# Patient Record
Sex: Male | Born: 1955
Health system: Southern US, Community
[De-identification: ages and names within clinical notes are randomized; demographics above are authoritative.]

---

## 2001-01-04 ENCOUNTER — Emergency Department (HOSPITAL_COMMUNITY): Admission: EM | Admit: 2001-01-04 | Discharge: 2001-01-04 | Payer: Self-pay | Admitting: Emergency Medicine

## 2001-01-04 ENCOUNTER — Encounter: Payer: Self-pay | Admitting: Emergency Medicine

## 2001-01-22 ENCOUNTER — Encounter: Admission: RE | Admit: 2001-01-22 | Discharge: 2001-01-22 | Payer: Self-pay | Admitting: Urology

## 2001-01-22 ENCOUNTER — Encounter: Payer: Self-pay | Admitting: Urology

## 2001-02-01 ENCOUNTER — Encounter: Payer: Self-pay | Admitting: Emergency Medicine

## 2001-02-01 ENCOUNTER — Emergency Department (HOSPITAL_COMMUNITY): Admission: EM | Admit: 2001-02-01 | Discharge: 2001-02-01 | Payer: Self-pay | Admitting: Emergency Medicine

## 2001-02-10 ENCOUNTER — Ambulatory Visit (HOSPITAL_COMMUNITY): Admission: RE | Admit: 2001-02-10 | Discharge: 2001-02-10 | Payer: Self-pay | Admitting: Urology

## 2004-12-12 ENCOUNTER — Encounter: Admission: RE | Admit: 2004-12-12 | Discharge: 2004-12-12 | Payer: Self-pay | Admitting: Specialist

## 2015-12-19 DIAGNOSIS — L821 Other seborrheic keratosis: Secondary | ICD-10-CM | POA: Diagnosis not present

## 2015-12-19 DIAGNOSIS — D225 Melanocytic nevi of trunk: Secondary | ICD-10-CM | POA: Diagnosis not present

## 2015-12-19 DIAGNOSIS — Z85828 Personal history of other malignant neoplasm of skin: Secondary | ICD-10-CM | POA: Diagnosis not present

## 2015-12-19 DIAGNOSIS — L570 Actinic keratosis: Secondary | ICD-10-CM | POA: Diagnosis not present

## 2016-04-02 DIAGNOSIS — Z125 Encounter for screening for malignant neoplasm of prostate: Secondary | ICD-10-CM | POA: Diagnosis not present

## 2016-04-02 DIAGNOSIS — Z0000 Encounter for general adult medical examination without abnormal findings: Secondary | ICD-10-CM | POA: Diagnosis not present

## 2016-04-08 DIAGNOSIS — N200 Calculus of kidney: Secondary | ICD-10-CM | POA: Diagnosis not present

## 2016-04-08 DIAGNOSIS — Z1212 Encounter for screening for malignant neoplasm of rectum: Secondary | ICD-10-CM | POA: Diagnosis not present

## 2016-04-08 DIAGNOSIS — Z8249 Family history of ischemic heart disease and other diseases of the circulatory system: Secondary | ICD-10-CM | POA: Diagnosis not present

## 2016-04-08 DIAGNOSIS — Z0001 Encounter for general adult medical examination with abnormal findings: Secondary | ICD-10-CM | POA: Diagnosis not present

## 2016-04-08 DIAGNOSIS — Z23 Encounter for immunization: Secondary | ICD-10-CM | POA: Diagnosis not present

## 2016-04-08 DIAGNOSIS — E663 Overweight: Secondary | ICD-10-CM | POA: Diagnosis not present

## 2016-12-18 DIAGNOSIS — Z85828 Personal history of other malignant neoplasm of skin: Secondary | ICD-10-CM | POA: Diagnosis not present

## 2016-12-18 DIAGNOSIS — D1801 Hemangioma of skin and subcutaneous tissue: Secondary | ICD-10-CM | POA: Diagnosis not present

## 2016-12-18 DIAGNOSIS — L821 Other seborrheic keratosis: Secondary | ICD-10-CM | POA: Diagnosis not present

## 2016-12-18 DIAGNOSIS — L570 Actinic keratosis: Secondary | ICD-10-CM | POA: Diagnosis not present

## 2016-12-18 DIAGNOSIS — L814 Other melanin hyperpigmentation: Secondary | ICD-10-CM | POA: Diagnosis not present

## 2017-05-04 DIAGNOSIS — Z0000 Encounter for general adult medical examination without abnormal findings: Secondary | ICD-10-CM | POA: Diagnosis not present

## 2017-05-07 DIAGNOSIS — Z0000 Encounter for general adult medical examination without abnormal findings: Secondary | ICD-10-CM | POA: Diagnosis not present

## 2017-05-07 DIAGNOSIS — Z1212 Encounter for screening for malignant neoplasm of rectum: Secondary | ICD-10-CM | POA: Diagnosis not present

## 2017-12-28 DIAGNOSIS — D1801 Hemangioma of skin and subcutaneous tissue: Secondary | ICD-10-CM | POA: Diagnosis not present

## 2017-12-28 DIAGNOSIS — D225 Melanocytic nevi of trunk: Secondary | ICD-10-CM | POA: Diagnosis not present

## 2017-12-28 DIAGNOSIS — L988 Other specified disorders of the skin and subcutaneous tissue: Secondary | ICD-10-CM | POA: Diagnosis not present

## 2017-12-28 DIAGNOSIS — L821 Other seborrheic keratosis: Secondary | ICD-10-CM | POA: Diagnosis not present

## 2017-12-28 DIAGNOSIS — L570 Actinic keratosis: Secondary | ICD-10-CM | POA: Diagnosis not present

## 2017-12-28 DIAGNOSIS — L820 Inflamed seborrheic keratosis: Secondary | ICD-10-CM | POA: Diagnosis not present

## 2018-05-12 DIAGNOSIS — E7800 Pure hypercholesterolemia, unspecified: Secondary | ICD-10-CM | POA: Diagnosis not present

## 2018-05-12 DIAGNOSIS — Z0000 Encounter for general adult medical examination without abnormal findings: Secondary | ICD-10-CM | POA: Diagnosis not present

## 2018-05-12 DIAGNOSIS — Z125 Encounter for screening for malignant neoplasm of prostate: Secondary | ICD-10-CM | POA: Diagnosis not present

## 2018-05-17 DIAGNOSIS — Z0000 Encounter for general adult medical examination without abnormal findings: Secondary | ICD-10-CM | POA: Diagnosis not present

## 2018-05-17 DIAGNOSIS — Z23 Encounter for immunization: Secondary | ICD-10-CM | POA: Diagnosis not present

## 2018-12-31 DIAGNOSIS — Z23 Encounter for immunization: Secondary | ICD-10-CM | POA: Diagnosis not present

## 2019-03-02 DIAGNOSIS — Z23 Encounter for immunization: Secondary | ICD-10-CM | POA: Diagnosis not present

## 2019-04-25 DIAGNOSIS — Z23 Encounter for immunization: Secondary | ICD-10-CM | POA: Diagnosis not present

## 2019-05-24 DIAGNOSIS — Z125 Encounter for screening for malignant neoplasm of prostate: Secondary | ICD-10-CM | POA: Diagnosis not present

## 2019-05-24 DIAGNOSIS — Z1159 Encounter for screening for other viral diseases: Secondary | ICD-10-CM | POA: Diagnosis not present

## 2019-05-24 DIAGNOSIS — Z0000 Encounter for general adult medical examination without abnormal findings: Secondary | ICD-10-CM | POA: Diagnosis not present

## 2019-05-27 DIAGNOSIS — Z1212 Encounter for screening for malignant neoplasm of rectum: Secondary | ICD-10-CM | POA: Diagnosis not present

## 2019-05-27 DIAGNOSIS — E7800 Pure hypercholesterolemia, unspecified: Secondary | ICD-10-CM | POA: Diagnosis not present

## 2019-05-27 DIAGNOSIS — Z0000 Encounter for general adult medical examination without abnormal findings: Secondary | ICD-10-CM | POA: Diagnosis not present

## 2020-06-21 NOTE — Progress Notes (Signed)
Subjective:    I'm seeing this patient as a consultation for:  Dr. Shelia Media. Note will be routed back to referring provider/PCP.  CC: R shoulder pain  I, Molly Weber, LAT, ATC, am serving as scribe for Dr. Lynne Leader.  HPI: Pt is a 64 y/o male presenting w/ c/o R shoulder pain x 1-2 months. No known MOI. Pt locates his pain to lateral deltoid to anterior aspect. Pain with shoulder flexion and IR. No UE numbness/tingling noted.  History of prior right shoulder dislocation in the 80s.  None since the late 80s early 90s.  Physically fit does weightlifting exercises regularly.  History left shoulder MRI arthrogram showing rotator cuff tendinopathy treated with PT in 2006.  Radiating pain: no Aggravating factors: attempts at R shoulder overhead AROM;  Treatments tried: Alieve, asprain, stretching  Past medical history, Surgical history, Family history, Social history, Allergies, and medications have been entered into the medical record, reviewed. Manages a college bookstore.  Review of Systems: No new headache, visual changes, nausea, vomiting, diarrhea, constipation, dizziness, abdominal pain, skin rash, fevers, chills, night sweats, weight loss, swollen lymph nodes, body aches, joint swelling, muscle aches, chest pain, shortness of breath, mood changes, visual or auditory hallucinations.   Objective:    Vitals:   06/22/20 0922  BP: 110/72  Pulse: 62  SpO2: 97%   General: Well Developed, well nourished, and in no acute distress.  Neuro/Psych: Alert and oriented x3, extra-ocular muscles intact, able to move all 4 extremities, sensation grossly intact. Skin: Warm and dry, no rashes noted.  Respiratory: Not using accessory muscles, speaking in full sentences, trachea midline.  Cardiovascular: Pulses palpable, no extremity edema. Abdomen: Does not appear distended. MSK: Right shoulder normal-appearing nontender. Normal motion. Intact strength abduction external/internal  rotation. Positive Hawkins and Neer's test.  Positive empty can test. Negative Yergason's and speeds test. Mildly positive O'Brien test. Negative clunk and relocation test. Pulses cap refill and sensation are intact distally.  Lab and Radiology Results  X-ray images right shoulder obtained today personally and independently interpreted No fractures visible.  No severe DJD glenohumeral joint.  Mild AC DJD.  Possible Hill-Sachs deformity present. Await formal radiology review  Diagnostic Limited MSK Ultrasound of: Right shoulder Biceps tendon intact normal-appearing Subscapularis tendon intact normal-appearing Supraspinatus tendon is intact without tear.  Moderate subacromial bursa thickness is present indicating bursitis. Infraspinatus tendon is intact. Sequelae of Hill-Sachs lesion present posterior and superior humeral head with V-shaped cortical depression. AC joint narrowed degenerative with effusion Impression: Subacromial bursitis, AC DJD, evidence Hill-Sachs lesion   Impression and Recommendations:    Assessment and Plan: 64 y.o. male with right shoulder pain due to rotator cuff tendinopathy, impingement/instability, and subacromial bursitis.  AC DJD may play a role here as well.  Discussed options.  Plan for physical therapy to focus on rotator cuff stabilization and strengthening.  Additionally home exercises taught in clinic today by ATC.  Also recommend Voltaren gel.  If not improving consider either steroid injection versus an MRI arthrogram to further characterize cause of pain and for potential surgical or injection planning.  Recommend recheck in 6 to 8 weeks.Marland Kitchen  PDMP not reviewed this encounter. Orders Placed This Encounter  Procedures  . [place] LIMITED JOINT SPACE STRUCTURES UP RIGHT(NO LINKED CHARGES)    Standing Status:   Future    Number of Occurrences:   1    Standing Expiration Date:   12/21/2020    Order Specific Question:   Reason for Exam (SYMPTOM  OR DIAGNOSIS  REQUIRED)    Answer:   chronic right shoulder pain    Order Specific Question:   Preferred imaging location?    Answer:   [facility]  . DG Shoulder Right    Standing Status:   Future    Number of Occurrences:   1    Standing Expiration Date:   06/22/2021    Order Specific Question:   Reason for Exam (SYMPTOM  OR DIAGNOSIS REQUIRED)    Answer:   chronic right sholuder pain    Order Specific Question:   Preferred imaging location?    Answer:   [facility]  . Ambulatory referral to Physical Therapy    Referral Priority:   Routine    Referral Type:   Physical Medicine    Referral Reason:   Specialty Services Required    Requested Specialty:   Physical Therapy   No orders of the defined types were placed in this encounter.   Discussed warning signs or symptoms. Please see discharge instructions. Patient expresses understanding.   The above documentation has been reviewed and is accurate and complete Lynne Leader, M.D.

## 2020-06-22 ENCOUNTER — Ambulatory Visit: Payer: Self-pay

## 2020-06-22 ENCOUNTER — Ambulatory Visit (INDEPENDENT_AMBULATORY_CARE_PROVIDER_SITE_OTHER): Payer: 59

## 2020-06-22 ENCOUNTER — Encounter: Payer: Self-pay | Admitting: Family Medicine

## 2020-06-22 ENCOUNTER — Ambulatory Visit (INDEPENDENT_AMBULATORY_CARE_PROVIDER_SITE_OTHER): Payer: 59 | Admitting: Family Medicine

## 2020-06-22 ENCOUNTER — Other Ambulatory Visit: Payer: Self-pay

## 2020-06-22 VITALS — BP 110/72 | HR 62 | Ht 67.0 in | Wt 156.0 lb

## 2020-06-22 DIAGNOSIS — C4491 Basal cell carcinoma of skin, unspecified: Secondary | ICD-10-CM | POA: Insufficient documentation

## 2020-06-22 DIAGNOSIS — M25511 Pain in right shoulder: Secondary | ICD-10-CM | POA: Diagnosis not present

## 2020-06-22 DIAGNOSIS — Z8739 Personal history of other diseases of the musculoskeletal system and connective tissue: Secondary | ICD-10-CM | POA: Diagnosis not present

## 2020-06-22 DIAGNOSIS — G8929 Other chronic pain: Secondary | ICD-10-CM | POA: Diagnosis not present

## 2020-06-22 NOTE — Patient Instructions (Addendum)
Thank you for coming in today.  I've referred you to Physical Therapy.  Let us know if you don't hear from them in one week.  Please get an Xray today before you leave [facility] at [email or web address] using code: [id number]  Please use voltaren gel up to 4x daily for pain as needed.   Recheck in 6-8 weeks especially if not improved.  Next step is either injection vs MRI arthrogram.    Shoulder Impingement Syndrome  Shoulder impingement syndrome is a condition that causes pain when connective tissues (tendons) surrounding the shoulder joint become pinched. These tendons are part of the group of muscles and tissues that help to stabilize the shoulder (rotator cuff). Beneath the rotator cuff is a fluid-filled sac (bursa) that allows the muscles and tendons to glide smoothly. The bursa may become swollen or irritated (bursitis). Bursitis, swelling in the rotator cuff tendons, or both conditions can decrease how much space is under a bone in the shoulder joint (acromion), resulting in impingement. What are the causes? Shoulder impingement syndrome may be caused by bursitis or swelling of the rotator cuff tendons, which may result from:  Repetitive overhead arm movements.  Falling onto the shoulder.  Weakness in the shoulder muscles. What increases the risk? You may be more likely to develop this condition if you:  Play sports that involve throwing, such as baseball.  Participate in sports such as tennis, volleyball, and swimming.  Work as a Curator, Games developer, or Architect. Some people are also more likely to develop impingement syndrome because of the shape of their acromion bone. What are the signs or symptoms? The main symptom of this condition is pain on the front or side of the shoulder. The pain may:  Get worse when lifting or raising the arm.  Get worse at night.  Wake you up from sleeping.  Feel sharp when the shoulder is moved and then fade to an ache. Other symptoms may  include:  Tenderness.  Stiffness.  Inability to raise the arm above shoulder level or behind the body.  Weakness. How is this diagnosed? This condition may be diagnosed based on:  Your symptoms and medical history.  A physical exam.  Imaging tests, such as: ? X-rays. ? MRI. ? Ultrasound. How is this treated? This condition may be treated by:  Resting your shoulder and avoiding all activities that cause pain or put stress on the shoulder.  Icing your shoulder.  NSAIDs to help reduce pain and swelling.  One or more injections of medicines to numb the area and reduce inflammation.  Physical therapy.  Surgery. This may be needed if nonsurgical treatments have not helped. Surgery may involve repairing the rotator cuff, reshaping the acromion, or removing the bursa. Follow these instructions at home: Managing pain, stiffness, and swelling   If directed, put ice on the injured area. ? Put ice in a plastic bag. ? Place a towel between your skin and the bag. ? Leave the ice on for 20 minutes, 2-3 times a day. Activity  Rest and return to your normal activities as told by your health care provider. Ask your health care provider what activities are safe for you.  Do exercises as told by your health care provider. General instructions  Do not use any products that contain nicotine or tobacco, such as cigarettes, e-cigarettes, and chewing tobacco. These can delay healing. If you need help quitting, ask your health care provider.  Ask your health care provider when it is safe for you  to drive.  Take over-the-counter and prescription medicines only as told by your health care provider.  Keep all follow-up visits as told by your health care provider. This is important. How is this prevented?  Give your body time to rest between periods of activity.  Be safe and responsible while being active. This will help you avoid falls.  Maintain physical fitness, including strength  and flexibility. Contact a health care provider if:  Your symptoms have not improved after 1-2 months of treatment and rest.  You cannot lift your arm away from your body. Summary  Shoulder impingement syndrome is a condition that causes pain when connective tissues (tendons) surrounding the shoulder joint become pinched.  The main symptom of this condition is pain on the front or side of the shoulder.  This condition is usually treated with rest, ice, and pain medicines as needed. This information is not intended to replace advice given to you by your health care provider. Make sure you discuss any questions you have with your health care provider. Document Revised: 10/29/2018 Document Reviewed: 12/30/2017 Elsevier Patient Education  2020 [employer].

## 2020-06-25 NOTE — Progress Notes (Signed)
Right shoulder x-ray is normal to radiology

## 2020-07-04 ENCOUNTER — Encounter: Payer: Self-pay | Admitting: Rehabilitative and Restorative Service Providers"

## 2020-07-04 ENCOUNTER — Other Ambulatory Visit: Payer: Self-pay

## 2020-07-04 ENCOUNTER — Ambulatory Visit (INDEPENDENT_AMBULATORY_CARE_PROVIDER_SITE_OTHER): Payer: 59 | Admitting: Rehabilitative and Restorative Service Providers"

## 2020-07-04 DIAGNOSIS — G8929 Other chronic pain: Secondary | ICD-10-CM | POA: Diagnosis not present

## 2020-07-04 DIAGNOSIS — M25611 Stiffness of right shoulder, not elsewhere classified: Secondary | ICD-10-CM

## 2020-07-04 DIAGNOSIS — M25511 Pain in right shoulder: Secondary | ICD-10-CM

## 2020-07-04 DIAGNOSIS — M6281 Muscle weakness (generalized): Secondary | ICD-10-CM | POA: Diagnosis not present

## 2020-07-04 NOTE — Patient Instructions (Signed)
Access Code: [email or web address] URL: [email or web address] Date: 07/04/2020 Prepared by: Scot Jun  Exercises Sleeper Stretch - 2 x daily - 7 x weekly - 1 sets - 5 reps - 30 hold Single Arm Doorway Pec Stretch at 90 Degrees Abduction - 2 x daily - 7 x weekly - 1 sets - 5 reps - 30 hold Shoulder External Rotation and Scapular Retraction with Resistance - 2 x daily - 7 x weekly - 2 sets - 10 reps Standing shoulder flexion wall slides - 2 x daily - 7 x weekly - 2 sets - 10 reps  Patient Education Pain Monitoring Model

## 2020-07-04 NOTE — Therapy (Signed)
[facility] [address] [place], [place], [address] Phone: [phone]   Fax:  [phone]  Physical Therapy Evaluation  Patient Details  Name: Mike Carter MRN: [id number] Date of Birth: 1956-03-01 Referring Provider (PT): Dr. Lynne Leader   Encounter Date: 07/04/2020   PT End of Session - 07/04/20 1506    Visit Number 1    Number of Visits 12    Date for PT Re-Evaluation 08/29/20    Progress Note Due on Visit 10    PT Start Time 1512    PT Stop Time 1543    PT Time Calculation (min) 31 min    Activity Tolerance Patient tolerated treatment well    Behavior During Therapy [facility] for tasks assessed/performed           History reviewed. No pertinent past medical history.  History reviewed. No pertinent surgical history.  There were no vitals filed for this visit.    Subjective Assessment - 07/04/20 1515    Subjective Pt. indicated having Rt shoulder pain c some difficulty c mobility, noted overhead, back.  Pt. saw MD who prefermed MSKUS showing bursitis.  Insidious onset about 1-2 months.    Limitations Lifting;Other (comment)   reaching   Diagnostic tests MSKUS    Patient Stated Goals Reduce pain, continue physical training    Currently in Pain? Yes    Pain Score 0-No pain   pain at worst 8/10   Pain Location Shoulder   anterior/posterior shoulder   Pain Orientation Right    Pain Descriptors / Indicators Stabbing    Pain Type Chronic pain    Pain Onset More than a month ago    Pain Frequency Intermittent    Aggravating Factors  reaching overhead, back, waking up due to symptoms but able to get back to sleep    Pain Relieving Factors rest, avoiding movement    Effect of Pain on Daily Activities self care/dressing, lifing/reaching activity at home [name]              [facility] PT Assessment - 07/04/20 0001      Assessment   Medical Diagnosis Rt shoulder pain    Referring Provider (PT) Dr. 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  Lt AROM WFL at this time for elevation, ER/IR.  End range symptoms for Rt arom    AROM Assessment Site Shoulder    Right/Left Shoulder  Left;Right    Right Shoulder Flexion 140 Degrees    Right Shoulder ABduction 135 Degrees    Right Shoulder Internal Rotation 65 Degrees   measured supine in 45 deg abd   Right Shoulder External Rotation 45 Degrees   measured supine in 45 deg abd     PROM   Overall PROM Comments end range discomfort c overpressure for Rt GH PROM.    PROM Assessment Site Shoulder    Right/Left Shoulder Left;Right    Right Shoulder Flexion 145 Degrees    Right Shoulder ABduction 135 Degrees    Right Shoulder Internal Rotation 70 Degrees   measured supine in 45 deg abd   Right Shoulder External Rotation 50 Degrees   measured supine in 45 deg abd     Strength   Strength Assessment Site Shoulder    Right/Left Shoulder Left;Right    Right Shoulder Flexion 4+/5    Right Shoulder ABduction 4+/5    Right Shoulder Internal Rotation 5/5    Right Shoulder External Rotation 5/5    Left Shoulder Flexion 5/5    Left  Shoulder ABduction 5/5    Left Shoulder Internal Rotation 5/5    Left Shoulder External Rotation 5/5      Palpation   Palpation comment TrP noted increased in Rt infraspinatus compared to Lt  Mild restriction in inferior, ap mobs of Rt GH jt in end range      Special Tests   Other special tests (-) Painful arc, lift off, drop arm on Rt UE                      Objective measurements completed on examination: See above findings.       [facility] Adult PT Treatment/Exercise - 07/04/20 0001      Exercises   Exercises Other Exercises;Shoulder    Other Exercises  HEP instruction/performance c trial set of each exercise for comprehension/education.  HEP consisting of sleeper IR stretch 30 sec, doorway ER stretch 30 sec, wall slides flexion, scap retraction c bilateral UE      Manual Therapy   Manual therapy comments g2-g3 inferior jt mobs in elevation mid range, ap mobs Rt GH jt c MWM ER in 45 deg abd                  PT Education - 07/04/20 1506    Education Details HEP, POC    Person(s) Educated Patient    Methods Explanation;Demonstration;Verbal cues;Handout    Comprehension Returned demonstration;Verbalized understanding               PT Long Term Goals - 07/04/20 1507      PT LONG TERM GOAL #1   Title Patient will demonstrate/report pain at worst less than or equal to 2/10 to facilitate minimal limitation in daily activity secondary to pain symptoms.    Time 8    Period Weeks    Status New    Target Date 08/29/20      PT LONG TERM GOAL #2   Title Patient will demonstrate independent use of home exercise program to facilitate ability to maintain/progress functional gains from skilled physical therapy services.    Time 8    Period Weeks    Status New    Target Date 08/29/20      PT LONG TERM GOAL #3   Title Patient will demonstrate return to work/recreational activity at previous level of function without limitations secondary due to condition  Time 8    Period Weeks    Status New    Target Date 08/29/20      PT LONG TERM GOAL #4   Title Patient will demonstrate Rt [place] joint mobility WFL to facilitate usual self care, dressing, reaching overhead at PLOF s limitation due to symptoms.    Time 8    Period Weeks    Status New    Target Date 08/29/20      PT LONG TERM GOAL #5   Title Patient will demonstrate Rt UE MMT 5/5 throughout to facilitate usual lifting, carrying in functional activity to PLOF s limitation.    Time 8    Period Weeks    Status New    Target Date 08/29/20                  Plan - 07/04/20 1508    Clinical Impression Statement Patient is a 64 y.o. male who comes to clinic with complaints of Rt shoulder pain with mobility, strength deficits that impair their ability to perform usual daily and recreational functional activities without increase difficulty/symptoms at this time.  Patient to benefit from skilled PT services to address impairments and limitations to improve to previous level of function without restriction secondary to condition.    Personal Factors and Comorbidities Other   history of shoulder dislocation, previos RTC tendinopathy c therapy   Examination-Activity Limitations Reach Overhead;Lift;Carry;Sleep    Examination-Participation Restrictions Community Activity;Other   workouts   Stability/Clinical Decision Making Stable/Uncomplicated    Clinical Decision Making Low    Rehab Potential Good    PT Frequency --   1-2x/week   PT Duration 8 weeks    PT Treatment/Interventions ADLs/Self Care Home Management;Electrical Stimulation;Iontophoresis 4mg /ml Dexamethasone;Moist Heat;Balance training;Therapeutic exercise;Therapeutic activities;Functional mobility training;Stair training;Gait training;Ultrasound;Neuromuscular re-education;Patient/family education;Manual techniques;Taping;Dry needling;Passive range of motion;Spinal Manipulations;Joint Manipulations    PT Next Visit Plan Reassess  mobility gains, continue to improve end range, possible TrP release for infraspinatus, subscap for motion gains.    PT Home Exercise Plan [email or web address]    Consulted and Agree with Plan of Care Patient           Patient will benefit from skilled therapeutic intervention in order to improve the following deficits and impairments:  Decreased endurance,Pain,Impaired UE functional use,Decreased strength,Decreased activity tolerance,Decreased mobility,Decreased range of motion  Visit Diagnosis: Chronic right shoulder pain  Muscle weakness (generalized)  Stiffness of right shoulder, not elsewhere classified     Problem List Patient Active Problem List   Diagnosis Date Noted   Basal cell carcinoma 06/22/2020   History of closed shoulder dislocation 06/22/2020    Scot Jun, PT, DPT, OCS, ATC 07/04/20  3:50 PM    [facility] [address] [place], [place], [address] Phone: [phone]   Fax:  [phone]  Name: Mike Carter MRN: [id number] Date of Birth: 1956-01-05

## 2020-07-09 ENCOUNTER — Other Ambulatory Visit: Payer: Self-pay

## 2020-07-09 ENCOUNTER — Ambulatory Visit (INDEPENDENT_AMBULATORY_CARE_PROVIDER_SITE_OTHER): Payer: 59 | Admitting: Rehabilitative and Restorative Service Providers"

## 2020-07-09 ENCOUNTER — Encounter: Payer: Self-pay | Admitting: Rehabilitative and Restorative Service Providers"

## 2020-07-09 DIAGNOSIS — M25611 Stiffness of right shoulder, not elsewhere classified: Secondary | ICD-10-CM | POA: Diagnosis not present

## 2020-07-09 DIAGNOSIS — M25511 Pain in right shoulder: Secondary | ICD-10-CM | POA: Diagnosis not present

## 2020-07-09 DIAGNOSIS — G8929 Other chronic pain: Secondary | ICD-10-CM

## 2020-07-09 DIAGNOSIS — M6281 Muscle weakness (generalized): Secondary | ICD-10-CM

## 2020-07-09 NOTE — Therapy (Addendum)
[facility] Physical Therapy [address] [place], [place], [address] Phone: [phone]   Fax:  [phone]  Physical Therapy Treatment/Discharge  Patient Details  Name: Mike Carter MRN: [id number] Date of Birth: 1956-01-10 Referring Provider (PT): Dr. Lynne Leader   Encounter Date: 07/09/2020   PT End of Session - 07/09/20 0844    Visit Number 2    Number of Visits 12    Date for PT Re-Evaluation 08/29/20    Progress Note Due on Visit 10    PT Start Time 0844    PT Stop Time 0923    PT Time Calculation (min) 39 min    Activity Tolerance Patient tolerated treatment well    Behavior During Therapy [facility] for tasks assessed/performed           History reviewed. No pertinent past medical history.  History reviewed. No pertinent surgical history.  There were no vitals filed for this visit.   Subjective Assessment - 07/09/20 0905    Subjective Pt. stated feeling some soreness overall after last visit but nothing major.  Still feeling some tightness in shoulder.    Limitations Lifting;Other (comment)   reaching   Diagnostic tests MSKUS    Patient Stated Goals Reduce pain, continue physical training    Currently in Pain? No/denies    Pain Onset More than a month ago                             [facility] Adult PT Treatment/Exercise - 07/09/20 0001      Shoulder Exercises: Standing   Other Standing Exercises wall slides scaption c lift off x 15    Other Standing Exercises tband rows 3 x 15, wall push up c SA press 2 x 10      Shoulder Exercises: Pulleys   Flexion 2 minutes   5 sec hold   ABduction 2 minutes   5 sec hold     Shoulder Exercises: Stretch   Other Shoulder Stretches IR c strap 30 sec x 5      Manual Therapy   Manual therapy comments g3 inferior jt mobs, MWM c ER in 45 deg c AP mob, compression to Rt infraspinatus                       PT Long Term Goals - 07/04/20 1507      PT LONG TERM GOAL #1   Title  Patient will demonstrate/report pain at worst less than or equal to 2/10 to facilitate minimal limitation in daily activity secondary to pain symptoms.    Time 8    Period Weeks    Status New    Target Date 08/29/20      PT LONG TERM GOAL #2   Title Patient will demonstrate independent use of home exercise program to facilitate ability to maintain/progress functional gains from skilled physical therapy services.    Time 8    Period Weeks    Status New    Target Date 08/29/20      PT LONG TERM GOAL #3   Title Patient will demonstrate return to work/recreational activity at previous level of function without limitations secondary due to condition    Time 8    Period Weeks    Status New    Target Date 08/29/20      PT LONG TERM GOAL #4   Title Patient will demonstrate Rt GH joint mobility [facility]  to facilitate usual self care, dressing, reaching overhead at PLOF s limitation due to symptoms.    Time 8    Period Weeks    Status New    Target Date 08/29/20      PT LONG TERM GOAL #5   Title Patient will demonstrate Rt UE MMT 5/5 throughout to facilitate usual lifting, carrying in functional activity to PLOF s limitation.    Time 8    Period Weeks    Status New    Target Date 08/29/20                 Plan - 07/09/20 0905    Clinical Impression Statement Pt. demonstrated improvement in IR mobility today compared to evaluation.  Still will benefit from skilled PT services c HEP to progress overall mobility for improved daily use.    Personal Factors and Comorbidities Other   history of shoulder dislocation, previos RTC tendinopathy c therapy   Examination-Activity Limitations Reach Overhead;Lift;Carry;Sleep    Examination-Participation Restrictions Community Activity;Other   workouts   Stability/Clinical Decision Making Stable/Uncomplicated    Rehab Potential Good    PT Frequency --   1-2x/week   PT Duration 8 weeks    PT Treatment/Interventions ADLs/Self Care Home  Management;Electrical Stimulation;Iontophoresis 1m/ml Dexamethasone;Moist Heat;Balance training;Therapeutic exercise;Therapeutic activities;Functional mobility training;Stair training;Gait training;Ultrasound;Neuromuscular re-education;Patient/family education;Manual techniques;Taping;Dry needling;Passive range of motion;Spinal Manipulations;Joint Manipulations    PT Next Visit Plan continue to improve end range, possible TrP release for infraspinatus, subscap for motion gains.    PT Home Exercise Plan [id number]    Consulted and Agree with Plan of Care Patient           Patient will benefit from skilled therapeutic intervention in order to improve the following deficits and impairments:  Decreased endurance,Pain,Impaired UE functional use,Decreased strength,Decreased activity tolerance,Decreased mobility,Decreased range of motion  Visit Diagnosis: Chronic right shoulder pain  Muscle weakness (generalized)  Stiffness of right shoulder, not elsewhere classified     Problem List Patient Active Problem List   Diagnosis Date Noted  . Basal cell carcinoma 06/22/2020  . History of closed shoulder dislocation 06/22/2020    MScot Jun PT, DPT, OCS, ATC 07/09/20  9:15 AM  PHYSICAL THERAPY DISCHARGE SUMMARY  Visits from Start of Care: 2  Current functional level related to goals / functional outcomes: See note   Remaining deficits: See note   Education / Equipment: HEP Plan: Patient agrees to discharge.  Patient goals were partially met. Patient is being discharged due to not returning since the last visit.  ?????     MScot Jun PT, DPT, OCS, ATC 09/04/20  10:20 AM    [facility] [address] [address]Phone: [phone]  Fax:  [phone] Name: Mike BISCEGLIAMRN: [id number] of Birth: 509/16/1957

## 2020-07-24 ENCOUNTER — Encounter: Payer: 59 | Admitting: Rehabilitative and Restorative Service Providers"

## 2020-07-31 ENCOUNTER — Encounter: Payer: 59 | Admitting: Rehabilitative and Restorative Service Providers"

## 2020-08-07 ENCOUNTER — Encounter: Payer: 59 | Admitting: Rehabilitative and Restorative Service Providers"

## 2021-06-17 ENCOUNTER — Other Ambulatory Visit: Payer: Self-pay | Admitting: Internal Medicine

## 2021-06-17 DIAGNOSIS — E7800 Pure hypercholesterolemia, unspecified: Secondary | ICD-10-CM

## 2021-07-09 ENCOUNTER — Ambulatory Visit
Admission: RE | Admit: 2021-07-09 | Discharge: 2021-07-09 | Disposition: A | Discharge status: Home / Self Care | Payer: No Typology Code available for payment source | Source: Ambulatory Visit | Attending: Internal Medicine | Admitting: Internal Medicine

## 2021-07-09 DIAGNOSIS — E7800 Pure hypercholesterolemia, unspecified: Secondary | ICD-10-CM

## 2022-08-15 IMAGING — CT CT CARDIAC CORONARY ARTERY CALCIUM SCORE
3 series · 14 of 20 positions shown, 16 images · non-contrast
Comparison: None.

CLINICAL DATA: 65-year-old Caucasian male with history of
hyperlipidemia and family history of heart disease.

EXAM:
CT CARDIAC CORONARY ARTERY CALCIUM SCORE
TECHNIQUE: Non-contrast imaging through the heart was performed using
prospective ECG gating. Image post processing was performed on an
independent workstation, allowing for quantitative analysis of the
heart and coronary arteries. Note that this exam targets the heart
and the chest was not imaged in its entirety.

[Series 2: calcium scoring 2.00 qr36 bestdiast 67% hrt calciu · axial · 0.43mm/px · z∈[+1586,+1670]mm · 4 of 70 slices shown]
[im 14/70  vessel]
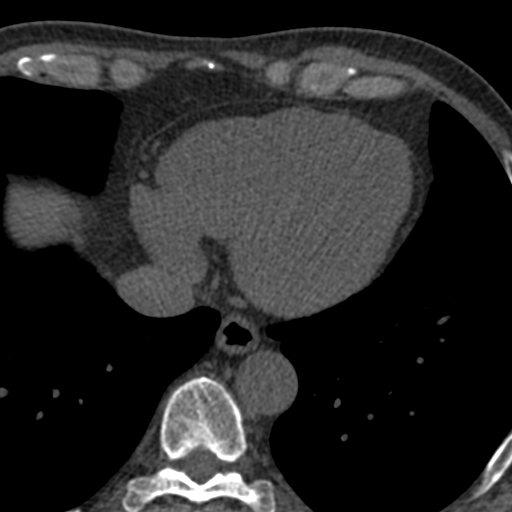
[im 28/70  vessel]
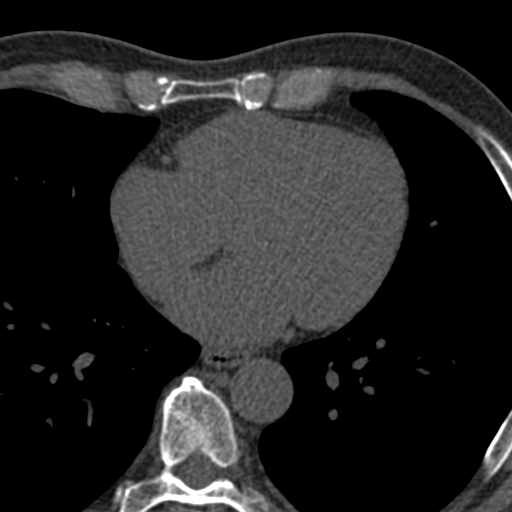
[im 42/70  vessel]
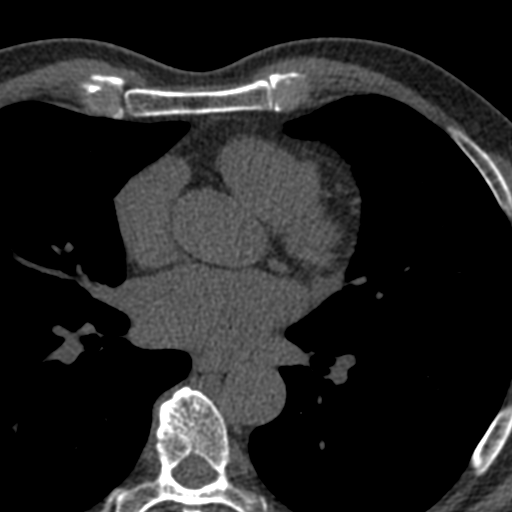
[im 56/70  vessel]
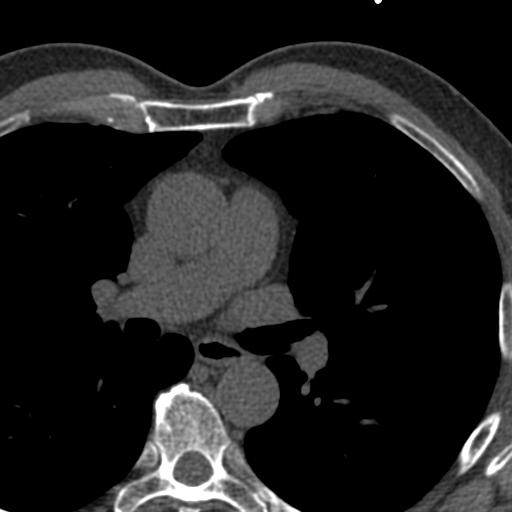

[Series 3: calcium scoring 2.00 br40 bestdiast 67% axial · axial · 0.53mm/px · z∈[+1582,+1674]mm · 5 of 70 slices shown, 7 images]
[im 12/70  vessel]
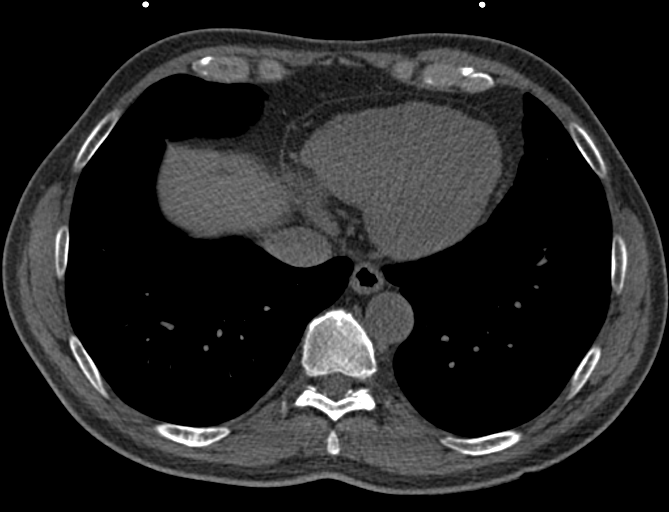
[im 12/70  lung]
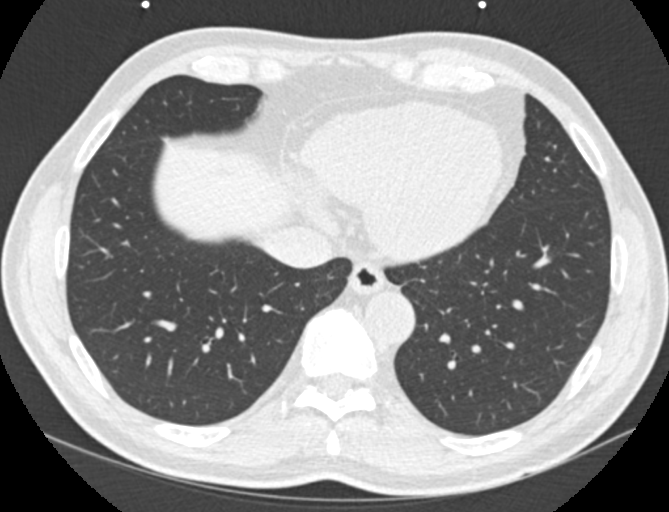
[im 24/70  vessel]
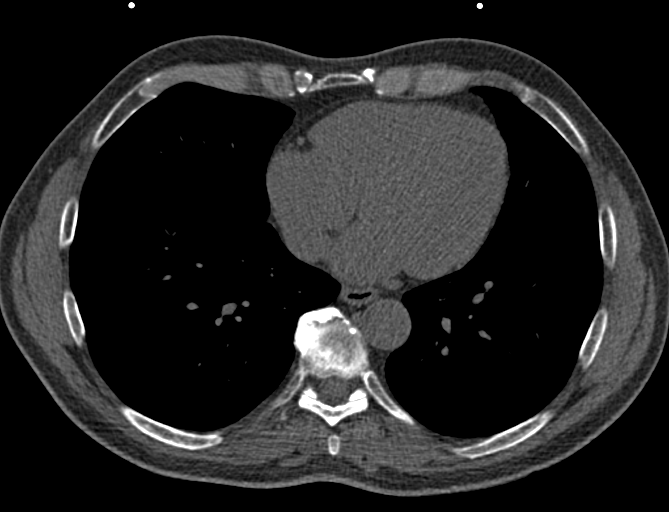
[im 35/70  vessel]
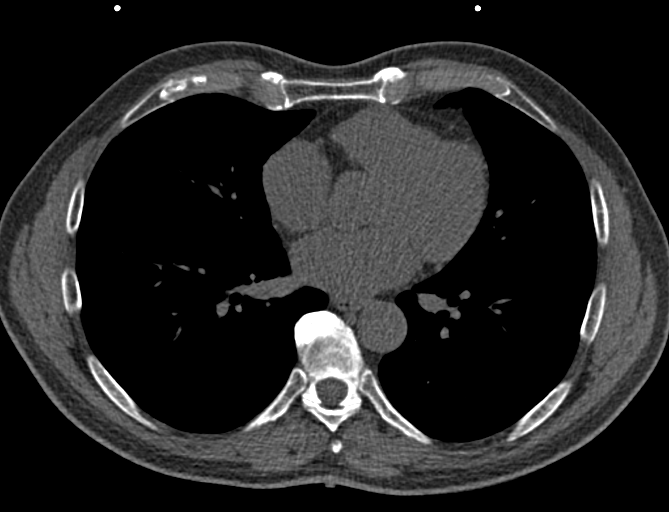
[im 47/70  vessel]
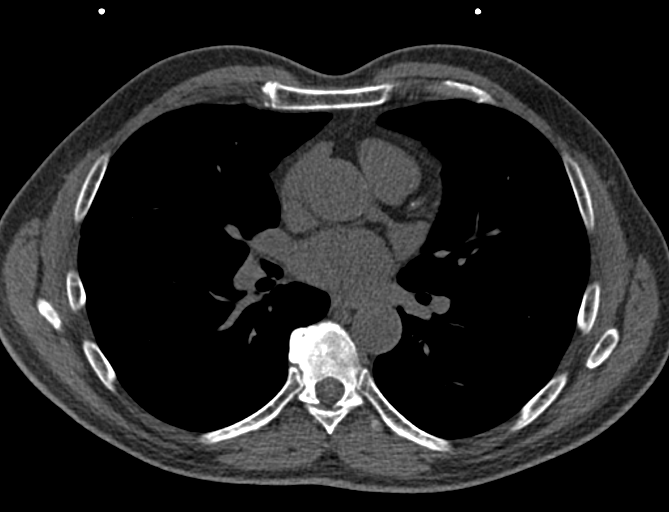
[im 58/70  vessel]
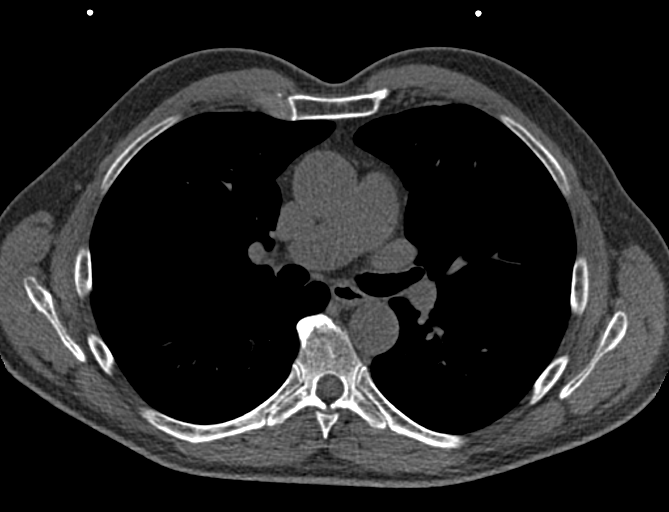
[im 58/70  lung]
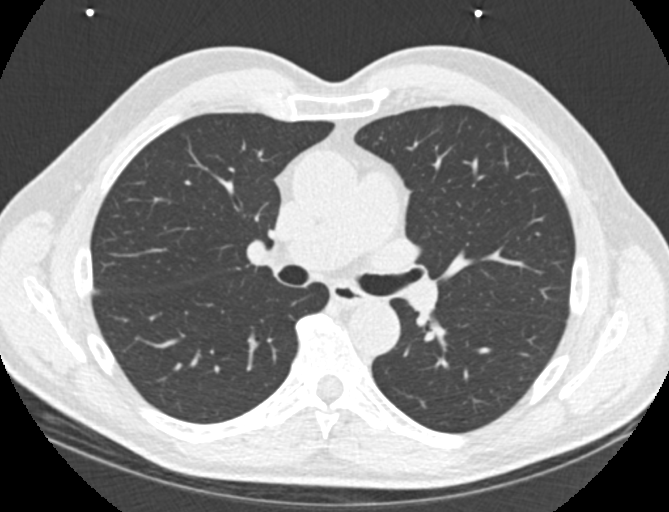

[Series 9: calcium scoring 2.00 br60 bestdiast 67% lungs · axial · 0.53mm/px · z∈[+1582,+1674]mm · 5 of 70 slices shown]
[im 12/70  vessel]
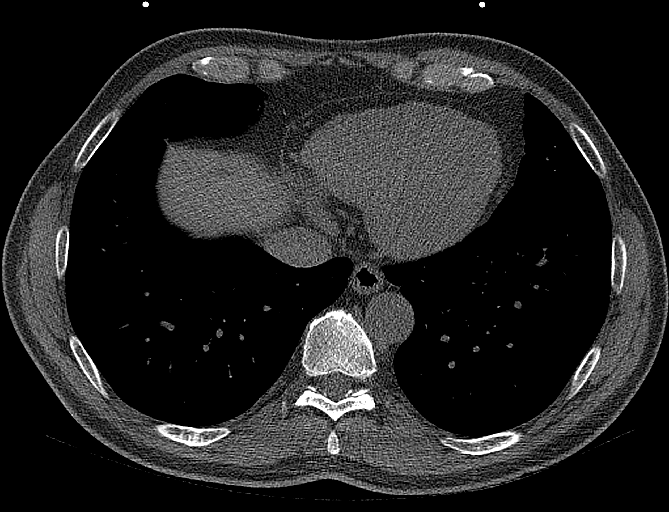
[im 24/70  vessel]
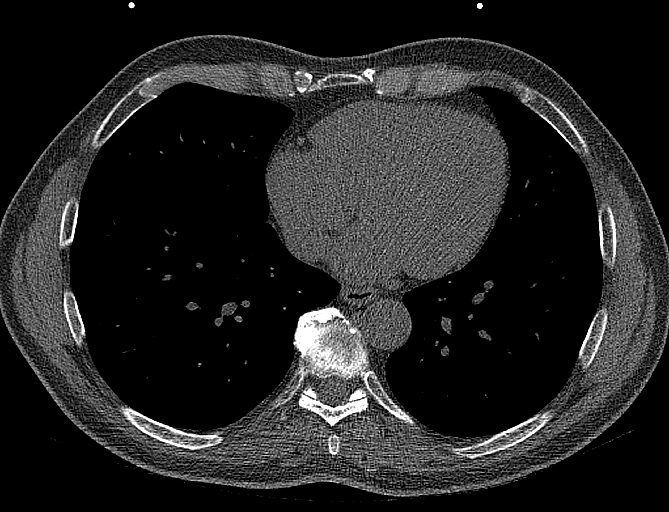
[im 35/70  vessel]
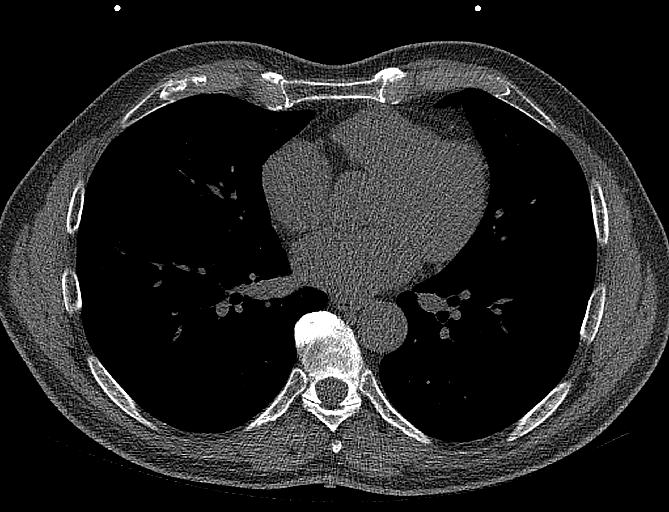
[im 47/70  vessel]
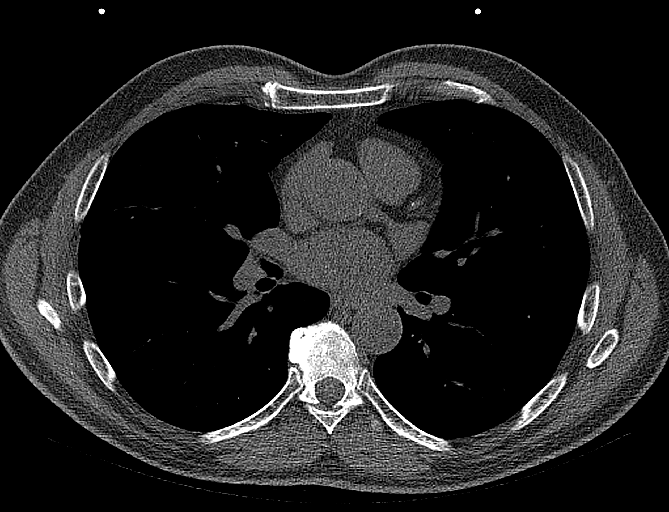
[im 58/70  vessel]
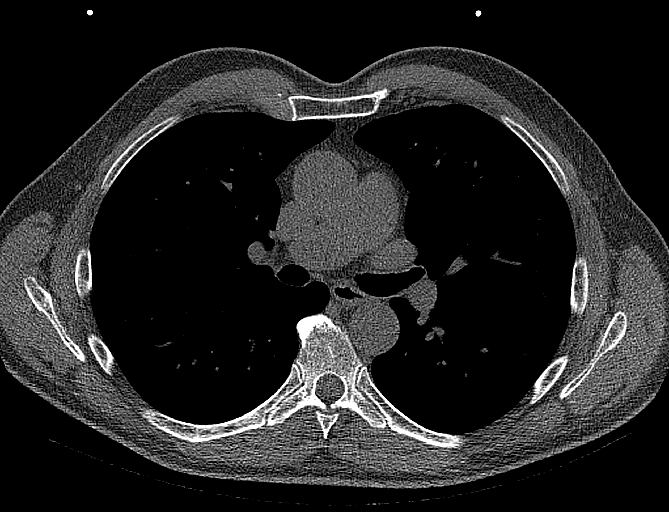

[14 of 20 positions shown; findings below may reference images not displayed]

FINDINGS: CORONARY CALCIUM SCORES:

Left Main: 0

LAD:

LCx: 0

RCA: 0

Total Agatston Score:

[HOSPITAL] percentile: 22

AORTA MEASUREMENTS:

Ascending Aorta: 33 mm

Descending Aorta: 26 mm

OTHER FINDINGS:

The heart size is within normal limits. No pericardial fluid is
identified. Visualized segments of the thoracic aorta and central
pulmonary arteries are normal in caliber. Visualized mediastinum and
hilar regions demonstrate no lymphadenopathy or masses. Visualized
lungs show no evidence of pulmonary edema, consolidation,
pneumothorax, nodule or pleural fluid. Visualized upper abdomen and
bony structures are unremarkable.
IMPRESSION: Coronary calcium score 2.4 is at the 22nd percentile for the
patient's age, sex and race.
